# Patient Record
Sex: Female | Born: 1985 | Hispanic: No | Marital: Married
Health system: Southern US, Community
[De-identification: ages and names within clinical notes are randomized; demographics above are authoritative.]

---
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
## 2012-02-20 ENCOUNTER — Ambulatory Visit: Payer: Self-pay | Admitting: Gynecology

## 2012-03-24 ENCOUNTER — Ambulatory Visit: Payer: Self-pay | Admitting: Gynecology

## 2014-05-11 ENCOUNTER — Other Ambulatory Visit (HOSPITAL_COMMUNITY): Payer: Self-pay | Admitting: Obstetrics & Gynecology

## 2014-05-11 DIAGNOSIS — N979 Female infertility, unspecified: Secondary | ICD-10-CM

## 2014-05-17 ENCOUNTER — Ambulatory Visit (HOSPITAL_COMMUNITY)
Admission: RE | Admit: 2014-05-17 | Discharge: 2014-05-17 | Disposition: A | Discharge status: Home / Self Care | Payer: BC Managed Care – PPO | Source: Ambulatory Visit | Attending: Obstetrics & Gynecology | Admitting: Obstetrics & Gynecology

## 2014-05-17 DIAGNOSIS — N979 Female infertility, unspecified: Secondary | ICD-10-CM | POA: Insufficient documentation

## 2014-05-17 MED ORDER — IOHEXOL 300 MG/ML  SOLN
20.0000 mL | Freq: Once | INTRAMUSCULAR | Status: AC | PRN
  Administered 2014-05-17: 10 mL

## 2015-11-16 IMAGING — RF DG HYSTEROGRAM
4 series · 4 of 4 positions shown · IV contrast (omnipaque)
Comparison: None.

FLUOROSCOPY TIME:  42 seconds

CLINICAL DATA: Infertility

EXAM:
HYSTEROSALPINGOGRAM
TECHNIQUE: Following cleansing of the cervix and vagina with Betadine solution,
a hysterosalpingogram was performed using a 5-French
hysterosalpingogram catheter and Omnipaque 300 contrast. The patient
tolerated the examination without difficulty.

[Series 1: run · 1 of 1 slices shown (1 of 4)]
[im 1/1]
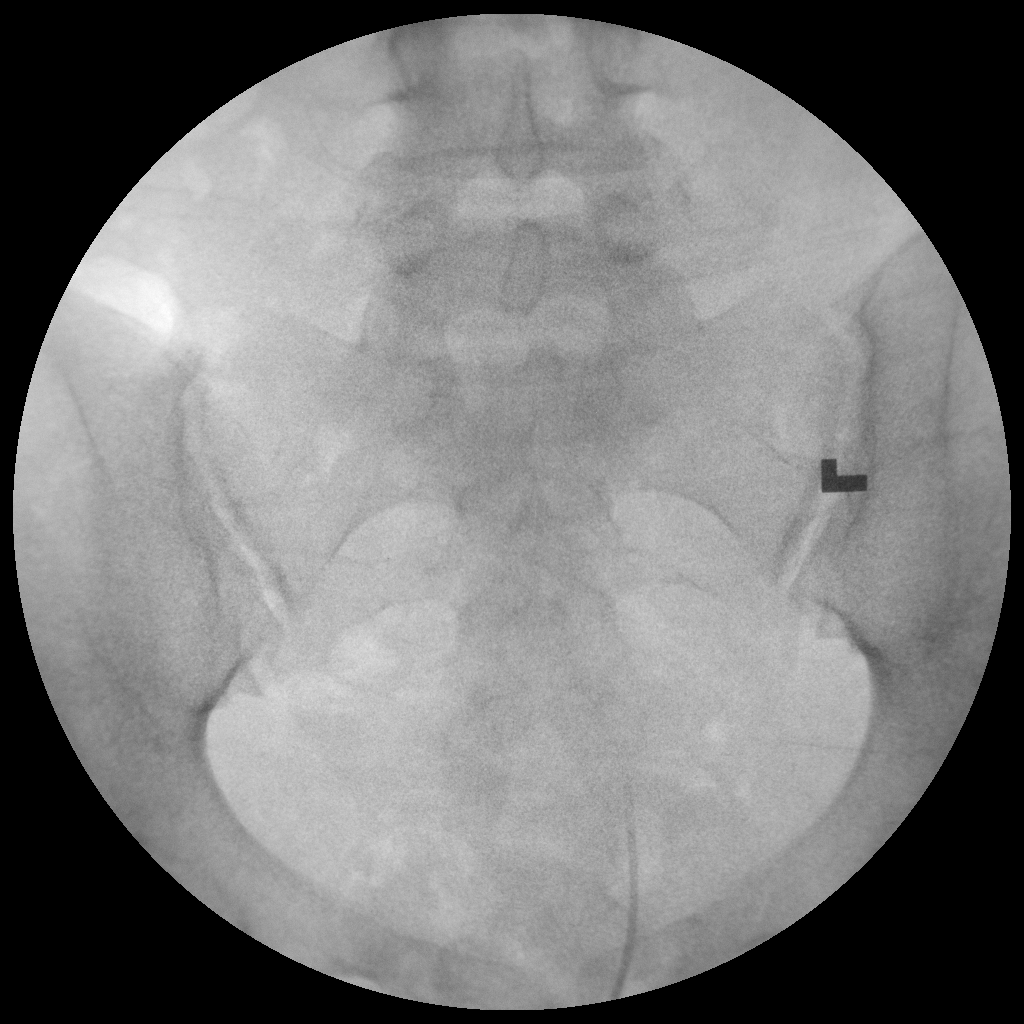

[Series 2: run · 1 of 1 slices shown (2 of 4)]
[im 1/1]
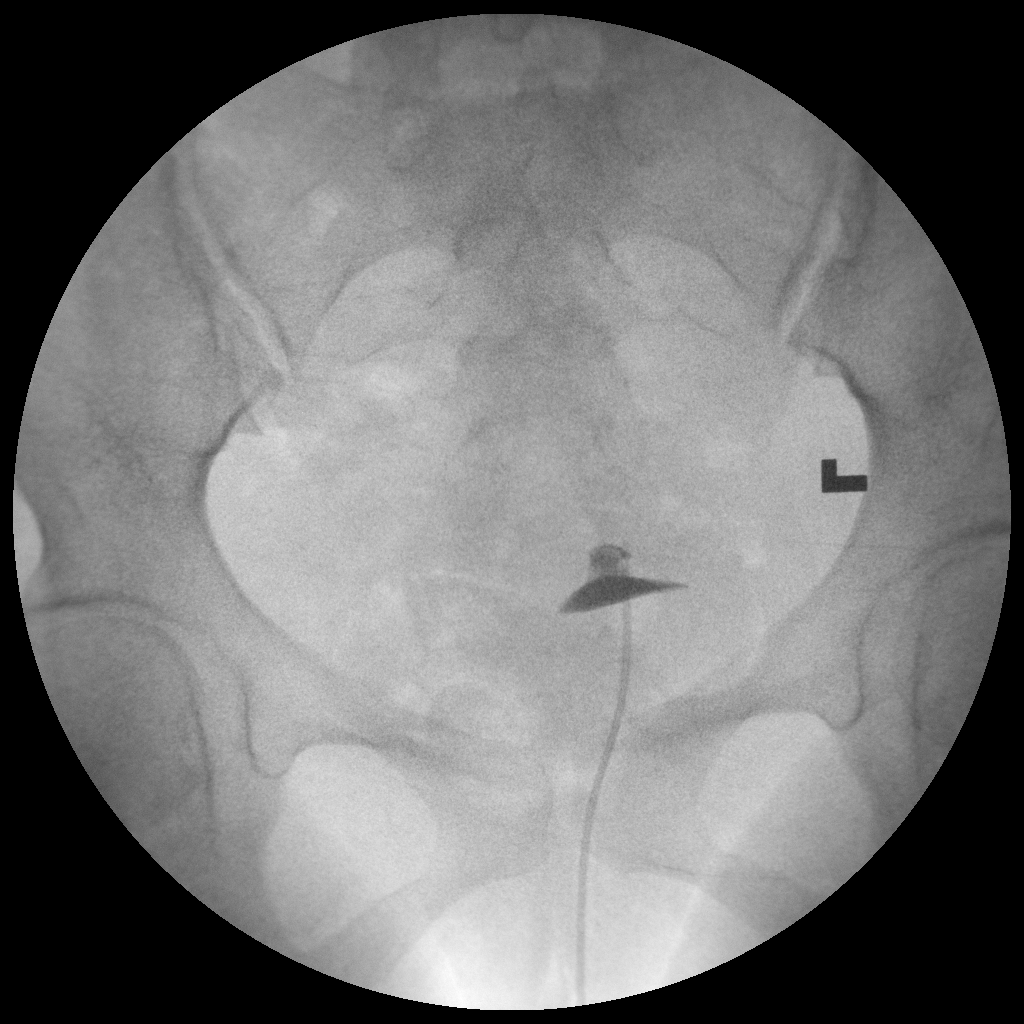

[Series 3: run · 1 of 1 slices shown (3 of 4)]
[im 1/1]
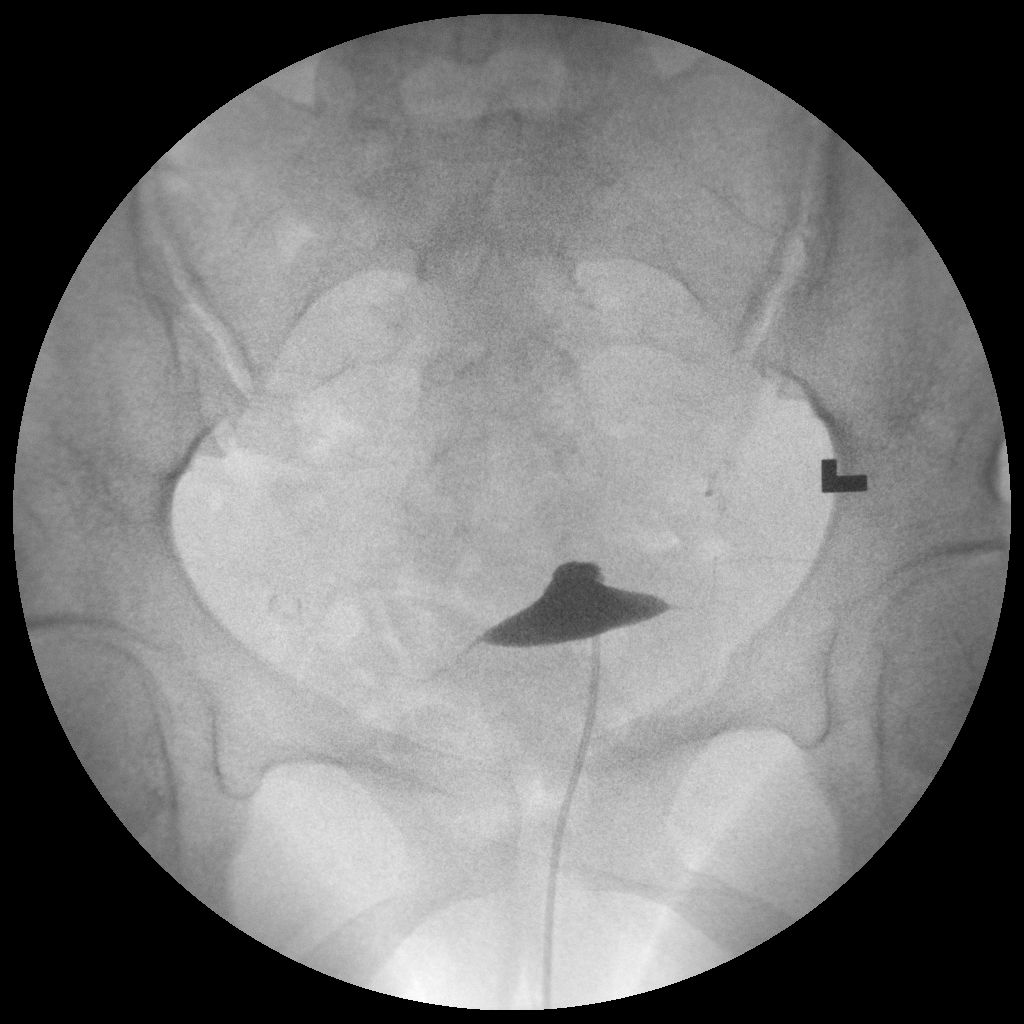

[Series 4: run · 1 of 1 slices shown (4 of 4)]
[im 1/1]
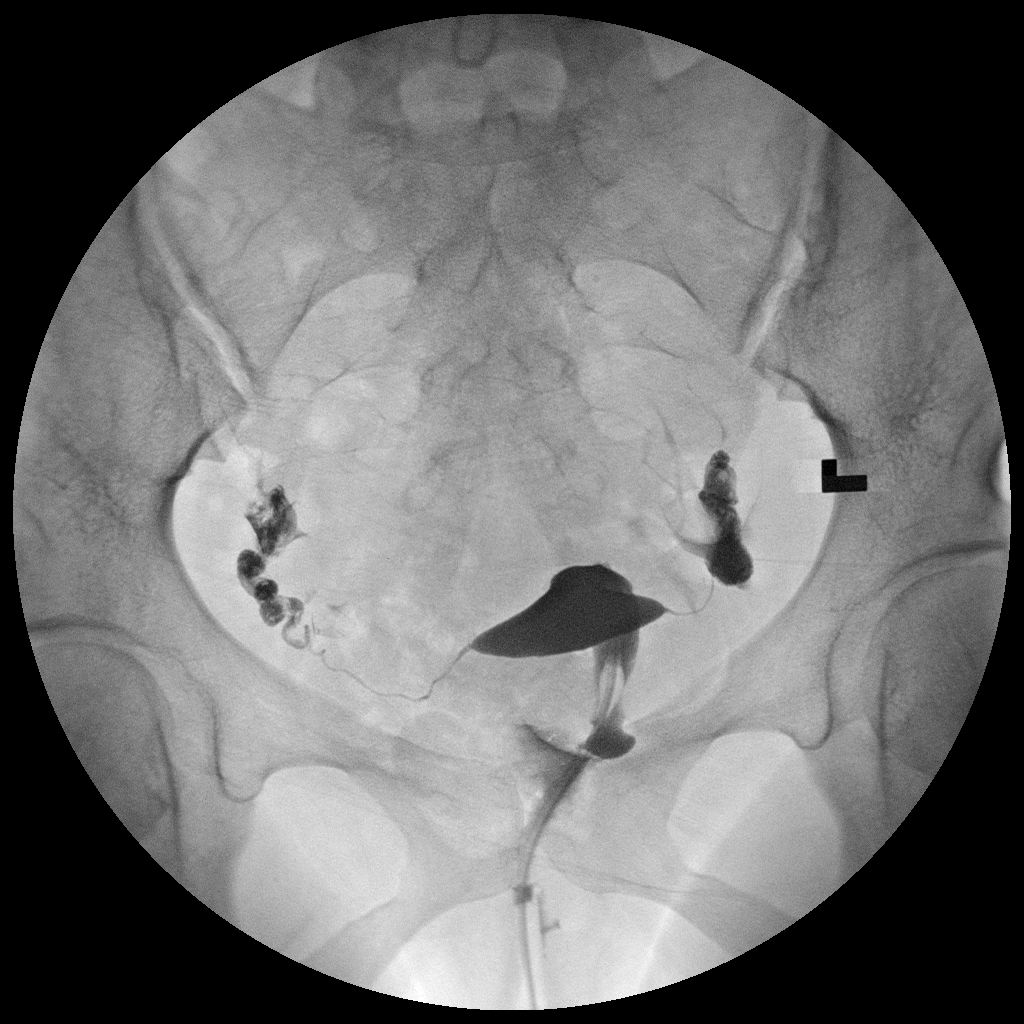

[4 of 4 positions shown; findings below may reference images not displayed]

FINDINGS: The endometrial canal is normal in contour and appearance. The
fallopian tubes are normal in appearance. Free intraperitoneal spill
is identified bilaterally.
IMPRESSION: Bilateral tubal patency.
